# Patient Record
Sex: Male | Born: 1965 | Race: White | Hispanic: No | Marital: Married | Smoking: Former smoker
Health system: Southern US, Community
[De-identification: ages and names within clinical notes are randomized; demographics above are authoritative.]

## PROBLEM LIST (undated history)

## (undated) DIAGNOSIS — F32A Depression, unspecified: Secondary | ICD-10-CM

## (undated) DIAGNOSIS — T7840XA Allergy, unspecified, initial encounter: Secondary | ICD-10-CM

## (undated) HISTORY — DX: Allergy, unspecified, initial encounter: T78.40XA

## (undated) HISTORY — DX: Depression, unspecified: F32.A

---

## 2015-05-18 ENCOUNTER — Other Ambulatory Visit: Payer: Self-pay | Admitting: Family Medicine

## 2015-05-18 ENCOUNTER — Ambulatory Visit
Admission: RE | Admit: 2015-05-18 | Discharge: 2015-05-18 | Disposition: A | Discharge status: Home / Self Care | Payer: BLUE CROSS/BLUE SHIELD | Source: Ambulatory Visit | Attending: Family Medicine | Admitting: Family Medicine

## 2015-05-18 DIAGNOSIS — W19XXXA Unspecified fall, initial encounter: Secondary | ICD-10-CM

## 2016-06-02 IMAGING — CR DG SHOULDER 2+V*L*
3 series · 3 of 3 positions shown · non-contrast
Comparison: None.

CLINICAL DATA: Fell from skateboard with outstretched arm 2 months
ago, left shoulder pain since then

EXAM:
LEFT SHOULDER - 2+ VIEW

[w shoulder grashey left]
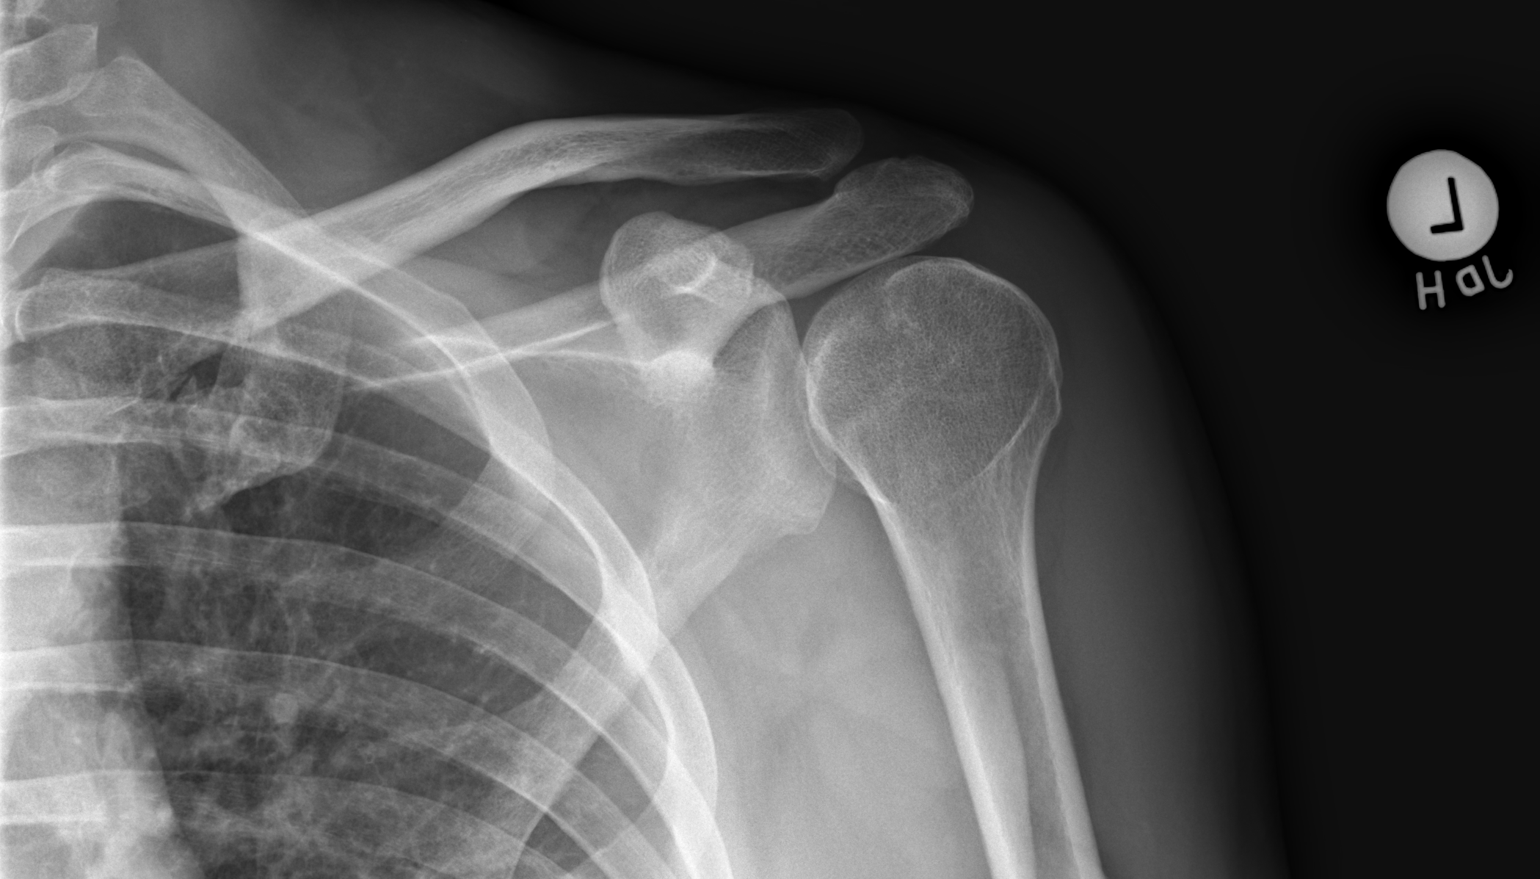

[w shoulder y-view left]
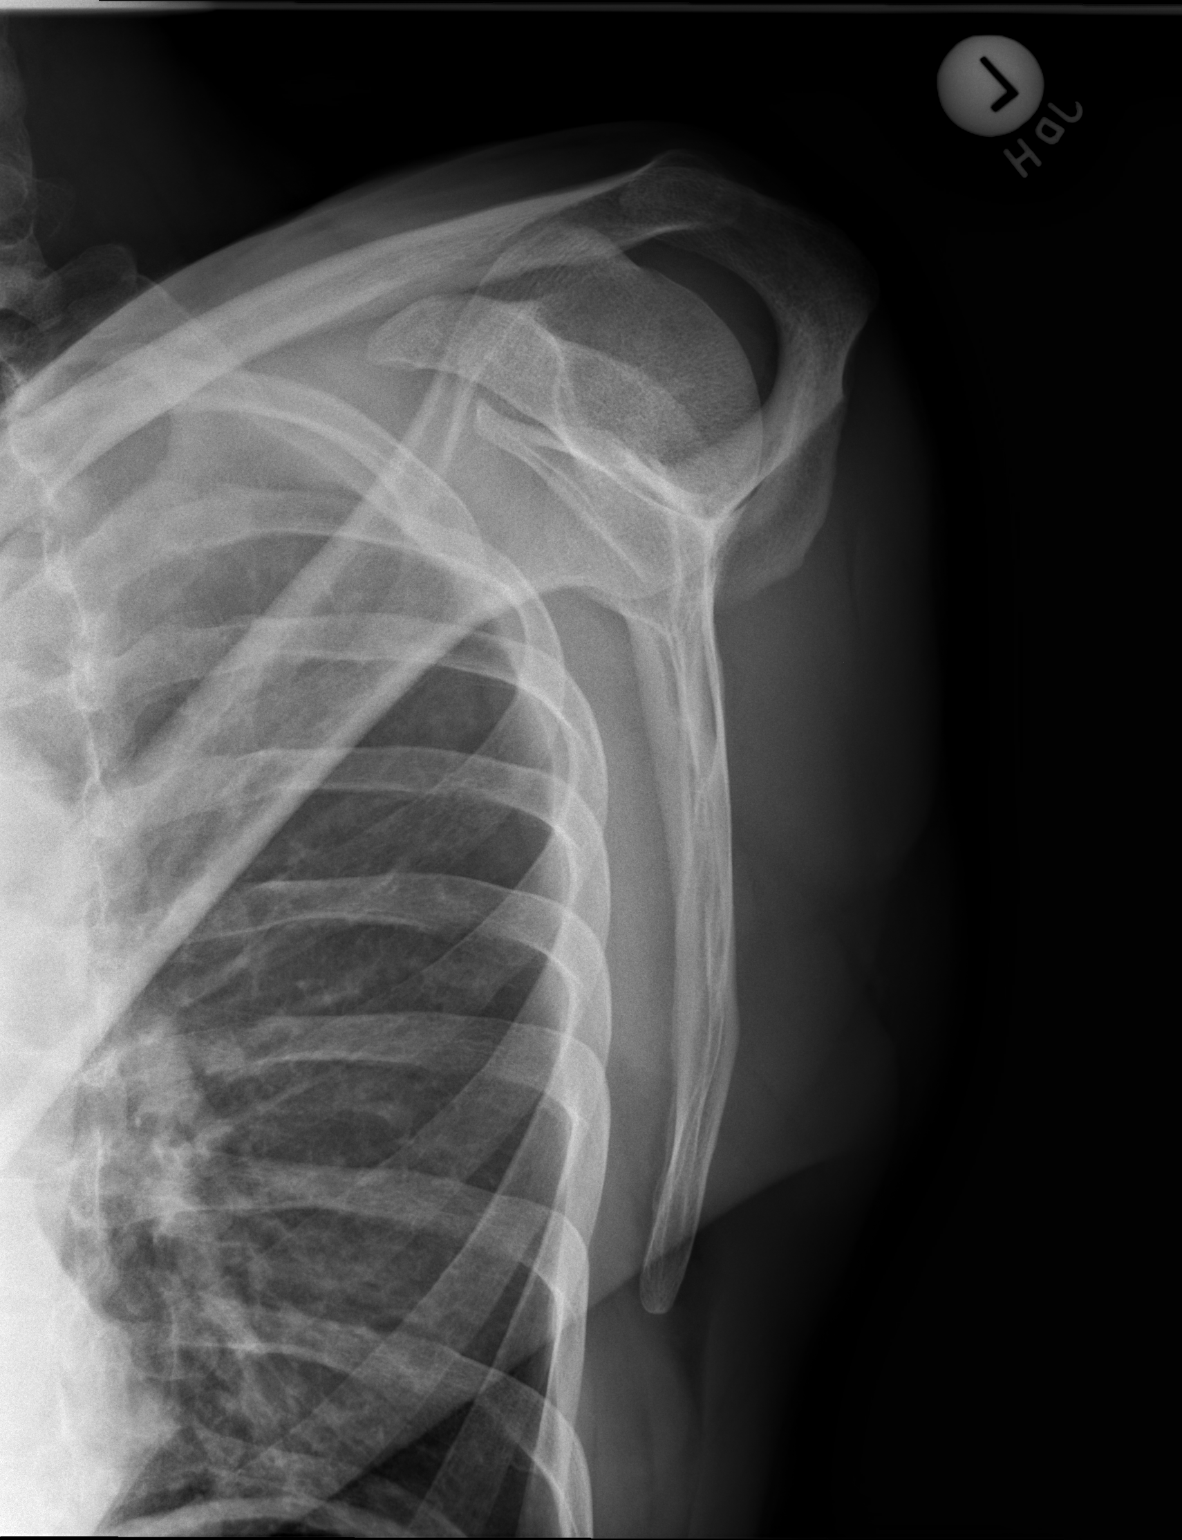

[w shoulder axillary left]
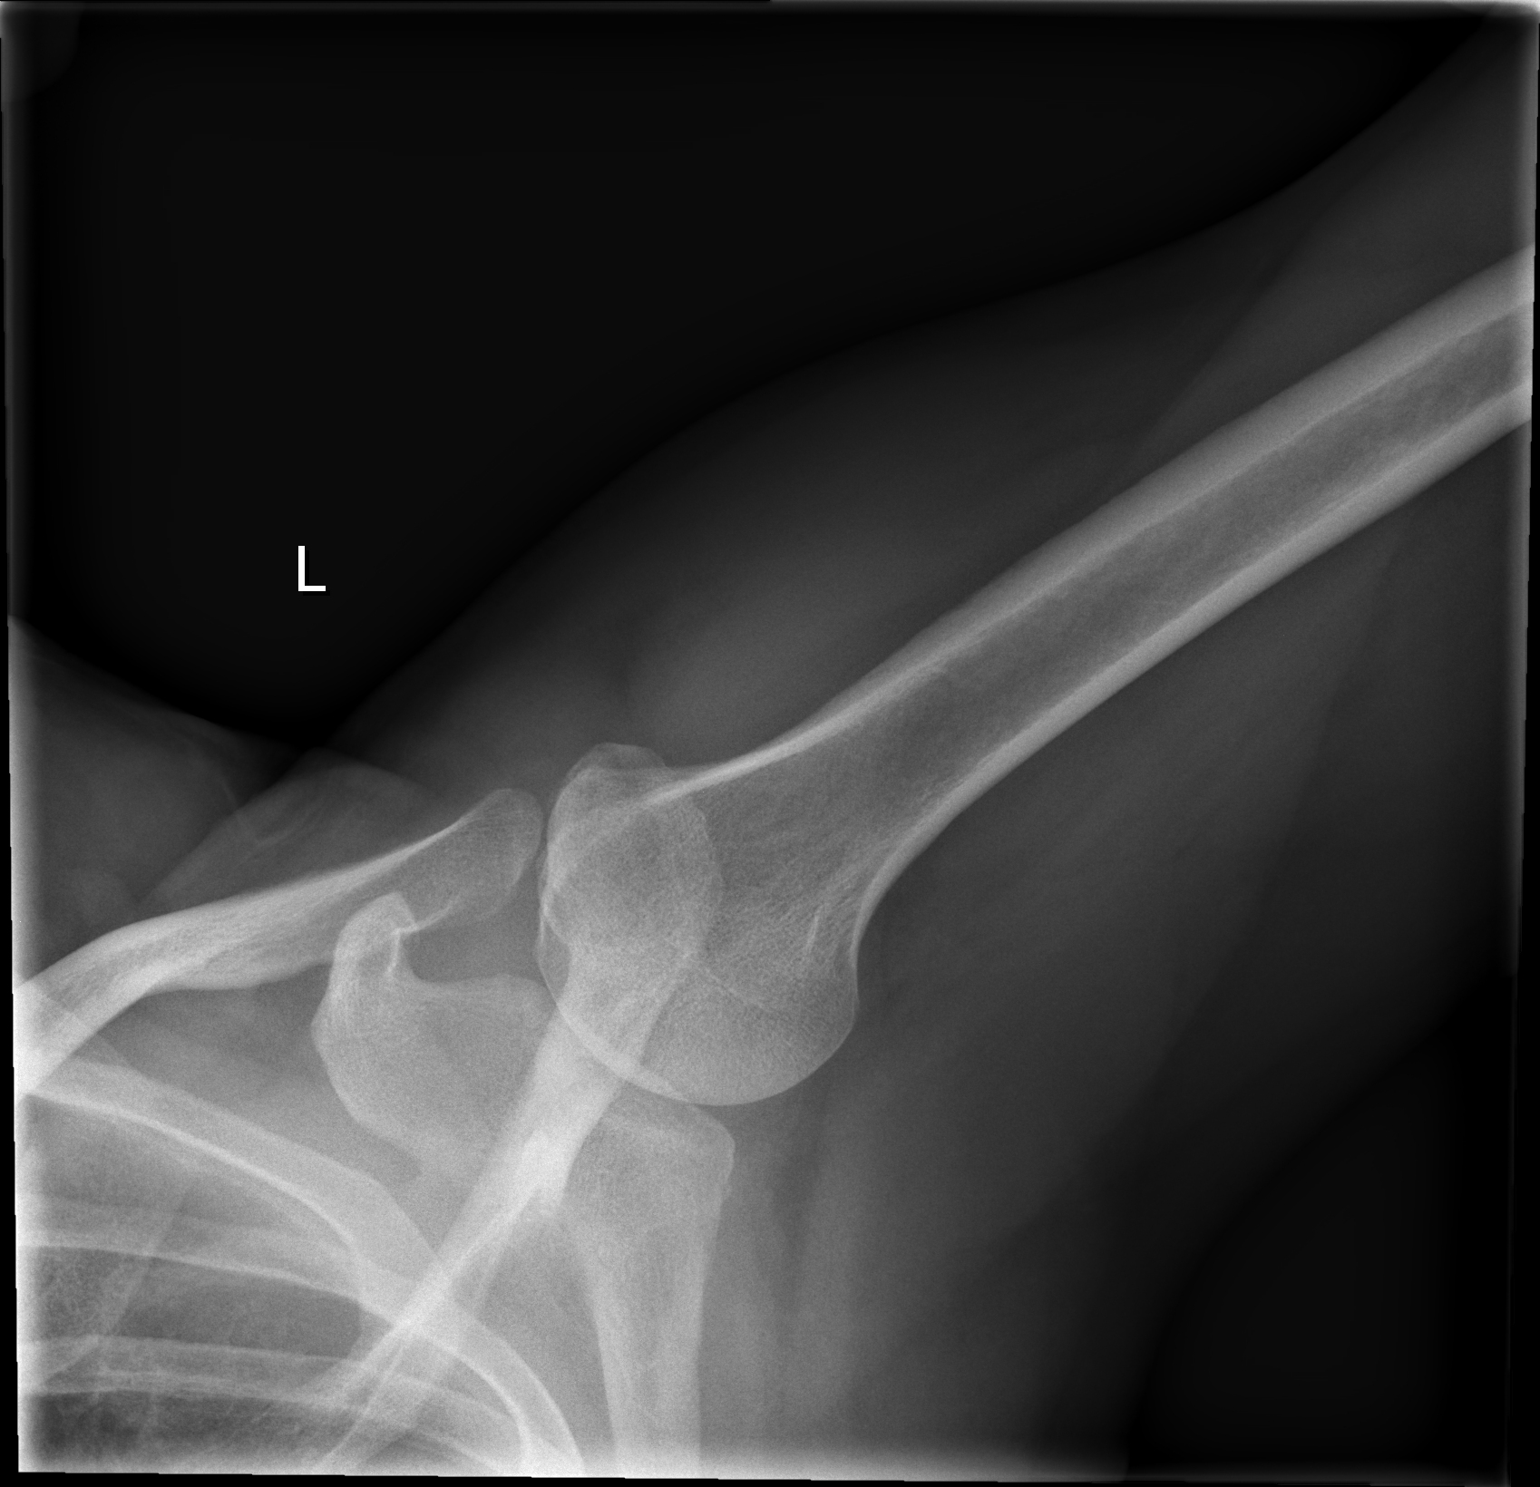

[3 of 3 positions shown; findings below may reference images not displayed]

FINDINGS: There is no evidence of fracture or dislocation. There is no
evidence of arthropathy or other focal bone abnormality. Soft
tissues are unremarkable.
IMPRESSION: Negative.

## 2016-11-21 DIAGNOSIS — H9313 Tinnitus, bilateral: Secondary | ICD-10-CM | POA: Diagnosis not present

## 2016-11-21 DIAGNOSIS — F329 Major depressive disorder, single episode, unspecified: Secondary | ICD-10-CM | POA: Diagnosis not present

## 2017-05-23 DIAGNOSIS — Z720 Tobacco use: Secondary | ICD-10-CM | POA: Diagnosis not present

## 2017-05-23 DIAGNOSIS — H9313 Tinnitus, bilateral: Secondary | ICD-10-CM | POA: Diagnosis not present

## 2017-05-23 DIAGNOSIS — G4730 Sleep apnea, unspecified: Secondary | ICD-10-CM | POA: Diagnosis not present

## 2017-05-23 DIAGNOSIS — F329 Major depressive disorder, single episode, unspecified: Secondary | ICD-10-CM | POA: Diagnosis not present

## 2017-05-23 DIAGNOSIS — R200 Anesthesia of skin: Secondary | ICD-10-CM | POA: Diagnosis not present

## 2017-05-24 DIAGNOSIS — M9902 Segmental and somatic dysfunction of thoracic region: Secondary | ICD-10-CM | POA: Diagnosis not present

## 2017-05-24 DIAGNOSIS — M531 Cervicobrachial syndrome: Secondary | ICD-10-CM | POA: Diagnosis not present

## 2017-05-24 DIAGNOSIS — M9901 Segmental and somatic dysfunction of cervical region: Secondary | ICD-10-CM | POA: Diagnosis not present

## 2017-05-24 DIAGNOSIS — M50123 Cervical disc disorder at C6-C7 level with radiculopathy: Secondary | ICD-10-CM | POA: Diagnosis not present

## 2017-07-24 DIAGNOSIS — G4719 Other hypersomnia: Secondary | ICD-10-CM | POA: Diagnosis not present

## 2017-09-04 DIAGNOSIS — G4733 Obstructive sleep apnea (adult) (pediatric): Secondary | ICD-10-CM | POA: Diagnosis not present

## 2017-09-12 DIAGNOSIS — G4733 Obstructive sleep apnea (adult) (pediatric): Secondary | ICD-10-CM | POA: Diagnosis not present

## 2017-10-10 DIAGNOSIS — G4733 Obstructive sleep apnea (adult) (pediatric): Secondary | ICD-10-CM | POA: Diagnosis not present

## 2017-11-10 DIAGNOSIS — G4733 Obstructive sleep apnea (adult) (pediatric): Secondary | ICD-10-CM | POA: Diagnosis not present

## 2017-12-10 DIAGNOSIS — G4733 Obstructive sleep apnea (adult) (pediatric): Secondary | ICD-10-CM | POA: Diagnosis not present

## 2017-12-17 DIAGNOSIS — G4733 Obstructive sleep apnea (adult) (pediatric): Secondary | ICD-10-CM | POA: Diagnosis not present

## 2017-12-25 DIAGNOSIS — G4733 Obstructive sleep apnea (adult) (pediatric): Secondary | ICD-10-CM | POA: Diagnosis not present

## 2017-12-25 DIAGNOSIS — F419 Anxiety disorder, unspecified: Secondary | ICD-10-CM | POA: Diagnosis not present

## 2018-05-21 DIAGNOSIS — Z23 Encounter for immunization: Secondary | ICD-10-CM | POA: Diagnosis not present

## 2018-05-21 DIAGNOSIS — R5383 Other fatigue: Secondary | ICD-10-CM | POA: Diagnosis not present

## 2018-05-21 DIAGNOSIS — F329 Major depressive disorder, single episode, unspecified: Secondary | ICD-10-CM | POA: Diagnosis not present

## 2018-05-21 DIAGNOSIS — G4719 Other hypersomnia: Secondary | ICD-10-CM | POA: Diagnosis not present

## 2018-08-12 DIAGNOSIS — F329 Major depressive disorder, single episode, unspecified: Secondary | ICD-10-CM | POA: Diagnosis not present

## 2019-02-17 DIAGNOSIS — F329 Major depressive disorder, single episode, unspecified: Secondary | ICD-10-CM | POA: Diagnosis not present

## 2019-10-31 ENCOUNTER — Ambulatory Visit: Payer: Self-pay | Attending: Internal Medicine

## 2019-10-31 DIAGNOSIS — Z23 Encounter for immunization: Secondary | ICD-10-CM

## 2019-10-31 NOTE — Progress Notes (Signed)
   Covid-19 Vaccination Clinic  Name:  Adryel Laible    MRN: [id number] DOB: 02/17/1966  10/31/2019  Mr. Latendresse was observed post Covid-19 immunization for 15 minutes without incident. He was provided with Vaccine Information Sheet and instruction to access the V-Safe system.   Mr. Goldwire was instructed to call 911 with any severe reactions post vaccine: Marland Kitchen Difficulty breathing  . Swelling of face and throat  . A fast heartbeat  . A bad rash all over body  . Dizziness and weakness   Immunizations Administered    Name Date Dose VIS Date Route   Pfizer COVID-19 Vaccine 10/31/2019  2:49 PM 0.3 mL 06/27/2019 Intramuscular   Manufacturer: [place]   Lot: [id number]   [place]: [id number]

## 2019-11-24 ENCOUNTER — Ambulatory Visit: Payer: Self-pay | Attending: Internal Medicine

## 2019-11-24 DIAGNOSIS — Z23 Encounter for immunization: Secondary | ICD-10-CM

## 2019-11-24 NOTE — Progress Notes (Signed)
   Covid-19 Vaccination Clinic  Name:  Dhanvin Storz    MRN: [id number] DOB: Nov 14, 1965  11/24/2019  Mr. Kellie was observed post Covid-19 immunization for 15 minutes without incident. He was provided with Vaccine Information Sheet and instruction to access the V-Safe system.   Mr. Lueken was instructed to call 911 with any severe reactions post vaccine: Marland Kitchen Difficulty breathing  . Swelling of face and throat  . A fast heartbeat  . A bad rash all over body  . Dizziness and weakness   Immunizations Administered    Name Date Dose VIS Date Route   Pfizer COVID-19 Vaccine 11/24/2019  2:26 PM 0.3 mL 09/10/2018 Intramuscular   Manufacturer: [place]   Lot: [id number]   [place]: [id number]

## 2020-09-24 DIAGNOSIS — Z1331 Encounter for screening for depression: Secondary | ICD-10-CM | POA: Diagnosis not present

## 2020-09-24 DIAGNOSIS — I10 Essential (primary) hypertension: Secondary | ICD-10-CM | POA: Diagnosis not present

## 2020-09-24 DIAGNOSIS — Z1339 Encounter for screening examination for other mental health and behavioral disorders: Secondary | ICD-10-CM | POA: Diagnosis not present

## 2021-01-12 DIAGNOSIS — M25522 Pain in left elbow: Secondary | ICD-10-CM | POA: Diagnosis not present

## 2021-02-21 DIAGNOSIS — G4710 Hypersomnia, unspecified: Secondary | ICD-10-CM | POA: Diagnosis not present

## 2021-02-22 DIAGNOSIS — G4710 Hypersomnia, unspecified: Secondary | ICD-10-CM | POA: Diagnosis not present

## 2021-02-24 DIAGNOSIS — G4733 Obstructive sleep apnea (adult) (pediatric): Secondary | ICD-10-CM | POA: Diagnosis not present

## 2021-03-22 DIAGNOSIS — G4733 Obstructive sleep apnea (adult) (pediatric): Secondary | ICD-10-CM | POA: Diagnosis not present

## 2021-05-02 DIAGNOSIS — G4733 Obstructive sleep apnea (adult) (pediatric): Secondary | ICD-10-CM | POA: Diagnosis not present

## 2021-07-15 DIAGNOSIS — R0981 Nasal congestion: Secondary | ICD-10-CM | POA: Diagnosis not present

## 2021-07-15 DIAGNOSIS — I10 Essential (primary) hypertension: Secondary | ICD-10-CM | POA: Diagnosis not present

## 2021-07-15 DIAGNOSIS — R051 Acute cough: Secondary | ICD-10-CM | POA: Diagnosis not present

## 2021-10-07 ENCOUNTER — Encounter: Payer: Self-pay | Admitting: Gastroenterology

## 2021-10-11 DIAGNOSIS — I10 Essential (primary) hypertension: Secondary | ICD-10-CM | POA: Diagnosis not present

## 2021-10-11 DIAGNOSIS — Z125 Encounter for screening for malignant neoplasm of prostate: Secondary | ICD-10-CM | POA: Diagnosis not present

## 2021-10-14 ENCOUNTER — Ambulatory Visit (AMBULATORY_SURGERY_CENTER): Payer: BC Managed Care – PPO | Admitting: *Deleted

## 2021-10-14 VITALS — Ht 70.0 in | Wt 190.0 lb

## 2021-10-14 DIAGNOSIS — Z1211 Encounter for screening for malignant neoplasm of colon: Secondary | ICD-10-CM

## 2021-10-14 MED ORDER — PEG 3350-KCL-NA BICARB-NACL 420 G PO SOLR: 4000.0000 mL | Freq: Once | ORAL | 0 refills | Status: AC

## 2021-10-14 NOTE — Progress Notes (Signed)
No egg or soy allergy known to patient  ?No issues known to pt with past sedation with any surgeries or procedures ?Patient denies ever being told they had issues or difficulty with intubation  ?No FH of Malignant Hyperthermia ?Pt is not on diet pills ?Pt is not on  home 02  ?Pt is not on blood thinners  ?Pt denies issues with constipation  ?No A fib or A flutter ? ?NO PA's for preps discussed with pt In PV today  ?Discussed with pt there will be an out-of-pocket cost for prep and that varies from $0 to 70 +  dollars - pt verbalized understanding  ? ?Due to the COVID-19 pandemic we are asking patients to follow certain guidelines in PV and the [facility]   ?Pt aware of COVID protocols and [facility] guidelines  ? ?PV completed over the phone. Pt verified name, DOB, address and insurance during PV today.  ?Pt emailed instruction packet with no personal information attached- pt aware - to [email or web address]  ?Pt encouraged to call with questions or issues.  ?If pt has My chart, procedure instructions sent via My Chart  ? ?

## 2021-10-18 DIAGNOSIS — Z0000 Encounter for general adult medical examination without abnormal findings: Secondary | ICD-10-CM | POA: Diagnosis not present

## 2021-10-18 DIAGNOSIS — R8281 Pyuria: Secondary | ICD-10-CM | POA: Diagnosis not present

## 2021-10-18 DIAGNOSIS — Z1331 Encounter for screening for depression: Secondary | ICD-10-CM | POA: Diagnosis not present

## 2021-10-18 DIAGNOSIS — Z1389 Encounter for screening for other disorder: Secondary | ICD-10-CM | POA: Diagnosis not present

## 2021-10-18 DIAGNOSIS — R82998 Other abnormal findings in urine: Secondary | ICD-10-CM | POA: Diagnosis not present

## 2021-10-28 ENCOUNTER — Telehealth: Payer: Self-pay | Admitting: Gastroenterology

## 2021-10-28 NOTE — Telephone Encounter (Signed)
Good Afternoon  Dr. Rush Landmark, ? ? ?Patient called and stated that he needed to reschedule his procedure on 4/18 at 8:30 due to not being able to get off of work. ? ? ?Patient was rescheduled for 5/2 at 8:30 ?

## 2021-11-01 ENCOUNTER — Encounter: Payer: Self-pay | Admitting: Gastroenterology

## 2021-11-15 ENCOUNTER — Encounter: Payer: Self-pay | Admitting: Gastroenterology

## 2021-11-15 ENCOUNTER — Ambulatory Visit (AMBULATORY_SURGERY_CENTER): Payer: BC Managed Care – PPO | Admitting: Gastroenterology

## 2021-11-15 VITALS — BP 130/94 | HR 58 | Temp 98.0°F | Resp 12 | Ht 70.0 in | Wt 190.0 lb

## 2021-11-15 DIAGNOSIS — Z1211 Encounter for screening for malignant neoplasm of colon: Secondary | ICD-10-CM | POA: Diagnosis not present

## 2021-11-15 DIAGNOSIS — D123 Benign neoplasm of transverse colon: Secondary | ICD-10-CM

## 2021-11-15 DIAGNOSIS — D128 Benign neoplasm of rectum: Secondary | ICD-10-CM | POA: Diagnosis not present

## 2021-11-15 DIAGNOSIS — D127 Benign neoplasm of rectosigmoid junction: Secondary | ICD-10-CM | POA: Diagnosis not present

## 2021-11-15 DIAGNOSIS — D124 Benign neoplasm of descending colon: Secondary | ICD-10-CM

## 2021-11-15 MED ORDER — SODIUM CHLORIDE 0.9 % IV SOLN: 500.0000 mL | Freq: Once | INTRAVENOUS | Status: DC

## 2021-11-15 NOTE — Patient Instructions (Signed)
Handouts given for Diverticulosis, High Fiber Diet and Polyps. ? ?Repeat colonoscopy in 3 years. ? ?No NSAIDS (Non-Steroidal anti-inflammatory drugs) for 2-3 weeks.  (These include, aspirin, aspirin-containing products, products containing salicylic acid like Pepto Bismol and Alka Seltzer), ibuprofen, advil, motrin, naproxen, aleve, goody powders, etc) Tylenol is ok to take as needed, see label for instructions.  ? ? ?YOU HAD AN ENDOSCOPIC PROCEDURE TODAY AT [facility]:   Refer to the procedure report that was given to you for any specific questions about what was found during the examination.  If the procedure report does not answer your questions, please call your gastroenterologist to clarify.  If you requested that your care partner not be given the details of your procedure findings, then the procedure report has been included in a sealed envelope for you to review at your convenience later. ? ?YOU SHOULD EXPECT: Some feelings of bloating in the abdomen. Passage of more gas than usual.  Walking can help get rid of the air that was put into your GI tract during the procedure and reduce the bloating. If you had a lower endoscopy (such as a colonoscopy or flexible sigmoidoscopy) you may notice spotting of blood in your stool or on the toilet paper. If you underwent a bowel prep for your procedure, you may not have a normal bowel movement for a few days. ? ?Please Note:  You might notice some irritation and congestion in your nose or some drainage.  This is from the oxygen used during your procedure.  There is no need for concern and it should clear up in a day or so. ? ?SYMPTOMS TO REPORT IMMEDIATELY: ? ?Following lower endoscopy (colonoscopy): ? Excessive amounts of blood in the stool ? Significant tenderness or worsening of abdominal pains ? Swelling of the abdomen that is new, acute ? Fever of 100?F or higher ?            Any NEW chest pain, excess coughing, shortness of breath ? ?For urgent  or emergent issues, a gastroenterologist can be reached at any hour by calling [phone]. ?Do not use MyChart messaging for urgent concerns.  ? ? ?DIET:  We do recommend a small meal at first, but then you may proceed to your regular diet.  Drink plenty of fluids but you should avoid alcoholic beverages for 24 hours. ? ?ACTIVITY:  You should plan to take it easy for the rest of today and you should NOT DRIVE or use heavy machinery until tomorrow (because of the sedation medicines used during the test).   ? ?FOLLOW UP: ?Our staff will call the number listed on your records 48-72 hours following your procedure to check on you and address any questions or concerns that you may have regarding the information given to you following your procedure. If we do not reach you, we will leave a message.  We will attempt to reach you two times.  During this call, we will ask if you have developed any symptoms of COVID 19. If you develop any symptoms (ie: fever, flu-like symptoms, shortness of breath, cough etc.) before then, please call [phone].  If you test positive for Covid 19 in the 2 weeks post procedure, please call and report this information to [place].   ? ?If any biopsies were taken you will be contacted by phone or by letter within the next 1-3 weeks.  Please call us at [phone] if you have not heard about the biopsies in 3 weeks.  ? ? ?  SIGNATURES/CONFIDENTIALITY: ?You and/or your care partner have signed paperwork which will be entered into your electronic medical record.  These signatures attest to the fact that that the information above on your After Visit Summary has been reviewed and is understood.  Full responsibility of the confidentiality of this discharge information lies with you and/or your care-partner.  ?

## 2021-11-15 NOTE — Progress Notes (Signed)
Vitals-CW  Pt's states no medical or surgical changes since previsit or office visit. 

## 2021-11-15 NOTE — Progress Notes (Signed)
? ?GASTROENTEROLOGY PROCEDURE H&P NOTE  ? ?Primary Care Physician: ?Pcp, No ? ?HPI: ?Jay Adams is a 56 y.o. male who presents for Colonoscopy for screening. ? ?Past Medical History:  ?Diagnosis Date  ? Allergy   ? seasonal  ? Depression   ? on citalopram  ? ?History reviewed. No pertinent surgical history. ?Current Outpatient Medications  ?Medication Sig Dispense Refill  ? buPROPion ER (WELLBUTRIN SR) 100 MG 12 hr tablet Take 100 mg by mouth every morning.    ? citalopram (CELEXA) 40 MG tablet Take 40 mg by mouth daily.    ? fluticasone (FLONASE) 50 MCG/ACT nasal spray     ? Multiple Vitamin (MULTIVITAMIN) tablet Take 1 tablet by mouth daily.    ? Propylhexedrine (BENZEDREX NA) Place into the nose.    ? ?Current Facility-Administered Medications  ?Medication Dose Route Frequency Provider Last Rate Last Admin  ? 0.9 %  sodium chloride infusion  500 mL Intravenous Once Mansouraty, Telford Nab., MD      ? ? ?Current Outpatient Medications:  ?  buPROPion ER (WELLBUTRIN SR) 100 MG 12 hr tablet, Take 100 mg by mouth every morning., Disp: , Rfl:  ?  citalopram (CELEXA) 40 MG tablet, Take 40 mg by mouth daily., Disp: , Rfl:  ?  fluticasone (FLONASE) 50 MCG/ACT nasal spray, , Disp: , Rfl:  ?  Multiple Vitamin (MULTIVITAMIN) tablet, Take 1 tablet by mouth daily., Disp: , Rfl:  ?  Propylhexedrine (BENZEDREX NA), Place into the nose., Disp: , Rfl:  ? ?Current Facility-Administered Medications:  ?  0.9 %  sodium chloride infusion, 500 mL, Intravenous, Once, Mansouraty, Telford Nab., MD ?Allergies  ?Allergen Reactions  ? Penicillins Other (See Comments)  ?  Pt mother - ? reaction  ? ?Family History  ?Problem Relation Age of Onset  ? Colon cancer Neg Hx   ? Colon polyps Neg Hx   ? Esophageal cancer Neg Hx   ? Stomach cancer Neg Hx   ? Rectal cancer Neg Hx   ? ?Social History  ? ?Socioeconomic History  ? Marital status: Married  ?  Spouse name: Not on file  ? Number of children: Not on file  ? Years of education: Not on file   ? Highest education level: Not on file  ?Occupational History  ? Not on file  ?Tobacco Use  ? Smoking status: Former  ?  Types: Cigarettes  ? Smokeless tobacco: Never  ?Vaping Use  ? Vaping Use: Never used  ?Substance and Sexual Activity  ? Alcohol use: Yes  ?  Comment: daily - has 2 a day  ? Drug use: Never  ? Sexual activity: Not on file  ?Other Topics Concern  ? Not on file  ?Social History Narrative  ? Not on file  ? ?Social Determinants of Health  ? ?Financial Resource Strain: Not on file  ?Food Insecurity: Not on file  ?Transportation Needs: Not on file  ?Physical Activity: Not on file  ?Stress: Not on file  ?Social Connections: Not on file  ?Intimate Partner Violence: Not on file  ? ? ?Physical Exam: ?Today's Vitals  ? 11/15/21 0744  ?BP: (!) 133/94  ?Pulse: 64  ?Temp: 98 ?F (36.7 ?C)  ?TempSrc: Temporal  ?SpO2: 100%  ?Weight: 190 lb (86.2 kg)  ?Height: '5\' 10"'$  (1.778 m)  ?PainSc: 0-No pain  ? ?Body mass index is 27.26 kg/m?. ?GEN: NAD ?EYE: Sclerae anicteric ?ENT: MMM ?CV: Non-tachycardic ?GI: Soft, NT/ND ?NEURO:  Alert & Oriented x 3 ? ?Lab  Results: ?No results for input(s): WBC, HGB, HCT, PLT in the last 72 hours. ?BMET ?No results for input(s): NA, K, CL, CO2, GLUCOSE, BUN, CREATININE, CALCIUM in the last 72 hours. ?LFT ?No results for input(s): PROT, ALBUMIN, AST, ALT, ALKPHOS, BILITOT, BILIDIR, IBILI in the last 72 hours. ?PT/INR ?No results for input(s): LABPROT, INR in the last 72 hours. ? ? ?Impression / Plan: ?This is a 56 y.o.male who presents for Colonoscopy for screening. ? ?The risks and benefits of endoscopic evaluation/treatment were discussed with the patient and/or family; these include but are not limited to the risk of perforation, infection, bleeding, missed lesions, lack of diagnosis, severe illness requiring hospitalization, as well as anesthesia and sedation related illnesses.  The patient's history has been reviewed, patient examined, no change in status, and deemed stable for  procedure.  The patient and/or family is agreeable to proceed.  ? ? ?Justice Britain, MD ?[facility] ?Advanced Endoscopy ?Office # [phone] ? ?

## 2021-11-15 NOTE — Progress Notes (Signed)
Called to room to assist during endoscopic procedure.  Patient ID and intended procedure confirmed with present staff. Received instructions for my participation in the procedure from the performing physician.  

## 2021-11-15 NOTE — Progress Notes (Signed)
Approx [id number]. Pt started wretching and produced bile colored fluid.  HOB immediately dropped to steep t-burg, oropharynx suctioned, sedation halted.  Pt allowed to wake up and cough and swallow on command.  No more sedation given.  Sats did drop lowest was 86.  Situation explained to pt in procedure room and PACU.  BS sounded clear immediate post.  Pt encouraged to keep clearing throw and coughing as needed ?

## 2021-11-15 NOTE — Op Note (Signed)
[place] ?Patient Name: Jay Adams ?Procedure Date: 11/15/2021 8:27 AM ?MRN: [id number] ?Endoscopist: Justice Britain , MD ?Age: 56 ?Referring MD:  ?Date of Birth: 1966/06/15 ?Gender: Male ?Account #: [id number] ?Procedure:                Colonoscopy ?Indications:              Screening for colorectal malignant neoplasm, This  ?                          is the patient's first colonoscopy ?Medicines:                Monitored Anesthesia Care ?Procedure:                Pre-Anesthesia Assessment: ?                          - Prior to the procedure, a History and Physical  ?                          was performed, and patient medications and  ?                          allergies were reviewed. The patient's tolerance of  ?                          previous anesthesia was also reviewed. The risks  ?                          and benefits of the procedure and the sedation  ?                          options and risks were discussed with the patient.  ?                          All questions were answered, and informed consent  ?                          was obtained. Prior Anticoagulants: The patient has  ?                          taken no previous anticoagulant or antiplatelet  ?                          agents. ASA Grade Assessment: II - A patient with  ?                          mild systemic disease. After reviewing the risks  ?                          and benefits, the patient was deemed in  ?                          satisfactory condition to undergo the procedure. ?  After obtaining informed consent, the colonoscope  ?                          was passed under direct vision. Throughout the  ?                          procedure, the patient's blood pressure, pulse, and  ?                          oxygen saturations were monitored continuously. The  ?                          Olympus CF-HQ190L (#[id number]) Colonoscope was  ?                          introduced through the  anus and advanced to the 5  ?                          cm into the ileum. The colonoscopy was unusually  ?                          difficult due to a redundant colon, significant  ?                          looping and the patient's position intolerance.  ?                          Successful completion of the procedure was aided by  ?                          decreasing the dose of sedation medication,  ?                          changing the patient to a supine position, changing  ?                          the patient to a prone position, using manual  ?                          pressure, straightening and shortening the scope to  ?                          obtain bowel loop reduction and using scope  ?                          torsion. The patient tolerated the procedure. The  ?                          quality of the bowel preparation was adequate. The  ?                          ileocecal valve, appendiceal orifice, and rectum  ?  were photographed. ?Scope In: 8:44:46 AM ?Scope Out: 9:37:54 AM ?Scope Withdrawal Time: 0 hours 38 minutes 44 seconds  ?Total Procedure Duration: 0 hours 53 minutes 8 seconds  ?Findings:                 The digital rectal exam findings include  ?                          hemorrhoids. Pertinent negatives include no  ?                          palpable rectal lesions. ?                          The colon (entire examined portion) was grossly  ?                          redundant and looping. This required significant  ?                          movement of the patient and pressure to allow [place] to  ?                          enter and intubate the cecum. ?                          Six sessile polyps were found in the recto-sigmoid  ?                          colon (1), descending colon (4) and transverse  ?                          colon (1). The polyps were 2 to 7 mm in size. These  ?                          polyps were removed with a cold snare. Resection  ?                           and retrieval were complete. ?                          Two sessile polyps were found in the rectum. The  ?                          polyps were 10 to 15 mm in size. These polyps were  ?                          removed with a lift and cut technique using a cold  ?                          snare. Mild oozing noted, but monitored these areas  ?                          and things stopped by  end of procedure. Resection  ?                          and retrieval were complete. ?                          A few small-mouthed diverticula were found in the  ?                          recto-sigmoid colon and sigmoid colon. ?                          Normal mucosa was found in the entire colon  ?                          otherwise. ?                          Non-bleeding non-thrombosed external and internal  ?                          hemorrhoids were found during retroflexion, during  ?                          perianal exam and during digital exam. The  ?                          hemorrhoids were Grade II (internal hemorrhoids  ?                          that prolapse but reduce spontaneously). ?Complications:            No immediate complications. ?Estimated Blood Loss:     Estimated blood loss was minimal. ?Impression:               - Hemorrhoids found on digital rectal exam. ?                          - Redundant colon. ?                          - Six 2 to 7 mm polyps at the recto-sigmoid colon,  ?                          in the descending colon and in the transverse  ?                          colon, removed with a cold snare. Resected and  ?                          retrieved. ?                          - Two 10 to 15 mm polyps in the rectum, removed  ?  using lift and cut and a cold snare. Resected and  ?                          retrieved. ?                          - Diverticulosis in the recto-sigmoid colon and in  ?                          the sigmoid colon. ?                           - Normal mucosa in the entire examined colon  ?                          otherwise. ?                          - Non-bleeding non-thrombosed external and internal  ?                          hemorrhoids. ?Recommendation:           - The patient will be observed post-procedure,  ?                          until all discharge criteria are met. ?                          - Discharge patient to home. ?                          - Patient has a contact number available for  ?                          emergencies. The signs and symptoms of potential  ?                          delayed complications were discussed with the  ?                          patient. Return to normal activities tomorrow.  ?                          Written discharge instructions were provided to the  ?                          patient. ?                          - High fiber diet. ?                          - Use FiberCon 1-2 tablets PO daily. ?                          - Continue present medications. ?                          -  Await pathology results. ?                          - Repeat colonoscopy in 3 years for surveillance. ?                          - Monitor for signs/symptoms of bleeding,  ?                          perforation, and infection. If issues please call  ?                          our number to get further assistance as needed. ?                          - The findings and recommendations were discussed  ?                          with the patient's family. ?                          - The findings and recommendations were discussed  ?                          with the patient. ?Justice Britain, MD ?11/15/2021 9:50:26 AM ?

## 2021-11-17 ENCOUNTER — Telehealth: Payer: Self-pay

## 2021-11-17 NOTE — Telephone Encounter (Signed)
?  Follow up Call- ? ? ?  11/15/2021  ?  7:44 AM  ?Call back number  ?Post procedure Call Back phone  # [phone]  ?Permission to leave phone message Yes  ?  ? ?Patient questions: ? ?Do you have a fever, pain , or abdominal swelling? No. ?Pain Score  0 * ? ?Have you tolerated food without any problems? Yes.   ? ?Have you been able to return to your normal activities? Yes.   ? ?Do you have any questions about your discharge instructions: ?Diet   No. ?Medications  No. ?Follow up visit  No. ? ?Do you have questions or concerns about your Care? No. ? ?Actions: ?* If pain score is 4 or above: ?No action needed, pain <4. ? ? ?

## 2021-11-20 ENCOUNTER — Encounter: Payer: Self-pay | Admitting: Gastroenterology

## 2021-11-23 DIAGNOSIS — L821 Other seborrheic keratosis: Secondary | ICD-10-CM | POA: Diagnosis not present

## 2021-11-23 DIAGNOSIS — L578 Other skin changes due to chronic exposure to nonionizing radiation: Secondary | ICD-10-CM | POA: Diagnosis not present

## 2021-11-23 DIAGNOSIS — C4441 Basal cell carcinoma of skin of scalp and neck: Secondary | ICD-10-CM | POA: Diagnosis not present

## 2021-11-23 DIAGNOSIS — D225 Melanocytic nevi of trunk: Secondary | ICD-10-CM | POA: Diagnosis not present

## 2021-12-28 DIAGNOSIS — C4441 Basal cell carcinoma of skin of scalp and neck: Secondary | ICD-10-CM | POA: Diagnosis not present

## 2022-11-15 DEATH — deceased
